# Patient Record
Sex: Female | Born: 2000 | Race: Black or African American | Hispanic: No | Marital: Single | Smoking: Never smoker
Health system: Southern US, Community
[De-identification: ages and names within clinical notes are randomized; demographics above are authoritative.]

---

## 2019-01-27 ENCOUNTER — Other Ambulatory Visit: Payer: Self-pay

## 2019-01-27 DIAGNOSIS — Z20822 Contact with and (suspected) exposure to covid-19: Secondary | ICD-10-CM

## 2019-01-28 LAB — NOVEL CORONAVIRUS, NAA: SARS-CoV-2, NAA: NOT DETECTED

## 2020-01-21 ENCOUNTER — Emergency Department (HOSPITAL_COMMUNITY): Payer: Managed Care, Other (non HMO)

## 2020-01-21 ENCOUNTER — Encounter (HOSPITAL_COMMUNITY): Payer: Self-pay | Admitting: Emergency Medicine

## 2020-01-21 ENCOUNTER — Other Ambulatory Visit: Payer: Self-pay

## 2020-01-21 ENCOUNTER — Emergency Department (HOSPITAL_COMMUNITY)
Admission: EM | Admit: 2020-01-21 | Discharge: 2020-01-21 | Disposition: A | Discharge status: Home / Self Care | Payer: Managed Care, Other (non HMO) | Attending: Emergency Medicine | Admitting: Emergency Medicine

## 2020-01-21 DIAGNOSIS — S20419A Abrasion of unspecified back wall of thorax, initial encounter: Secondary | ICD-10-CM | POA: Diagnosis not present

## 2020-01-21 DIAGNOSIS — Y939 Activity, unspecified: Secondary | ICD-10-CM | POA: Diagnosis not present

## 2020-01-21 DIAGNOSIS — Y998 Other external cause status: Secondary | ICD-10-CM | POA: Insufficient documentation

## 2020-01-21 DIAGNOSIS — R0789 Other chest pain: Secondary | ICD-10-CM

## 2020-01-21 DIAGNOSIS — S299XXA Unspecified injury of thorax, initial encounter: Secondary | ICD-10-CM | POA: Diagnosis present

## 2020-01-21 DIAGNOSIS — F1210 Cannabis abuse, uncomplicated: Secondary | ICD-10-CM | POA: Insufficient documentation

## 2020-01-21 DIAGNOSIS — Y9289 Other specified places as the place of occurrence of the external cause: Secondary | ICD-10-CM | POA: Insufficient documentation

## 2020-01-21 DIAGNOSIS — M542 Cervicalgia: Secondary | ICD-10-CM | POA: Diagnosis not present

## 2020-01-21 MED ORDER — METHOCARBAMOL 500 MG PO TABS: 500.0000 mg | ORAL_TABLET | Freq: Two times a day (BID) | ORAL | 0 refills | Status: AC

## 2020-01-21 MED ORDER — NAPROXEN 500 MG PO TABS: 500.0000 mg | ORAL_TABLET | Freq: Two times a day (BID) | ORAL | 0 refills | Status: AC

## 2020-01-21 NOTE — Discharge Instructions (Signed)
Take the prescribed medication as directed.  Can try to use heat to help with muscle soreness of the neck as well. Follow-up with your primary care doctor. Return to the ED for new or worsening symptoms.
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
## 2020-02-27 NOTE — Progress Notes (Signed)
Patient did not show for appointment.   

## 2020-02-28 ENCOUNTER — Encounter: Payer: Managed Care, Other (non HMO) | Admitting: Family
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
